# Patient Record
Sex: Female | Born: 1975
Health system: Southern US, Community
[De-identification: ages and names within clinical notes are randomized; demographics above are authoritative.]

## PROBLEM LIST (undated history)

## (undated) DIAGNOSIS — K589 Irritable bowel syndrome without diarrhea: Secondary | ICD-10-CM

## (undated) HISTORY — PX: ADENOIDECTOMY: SUR15

## (undated) HISTORY — PX: MANDIBLE SURGERY: SHX707

---

## 2016-10-04 ENCOUNTER — Encounter (HOSPITAL_COMMUNITY): Payer: Self-pay | Admitting: Family Medicine

## 2016-10-04 ENCOUNTER — Ambulatory Visit (HOSPITAL_COMMUNITY)
Admission: EM | Admit: 2016-10-04 | Discharge: 2016-10-04 | Disposition: A | Discharge status: Home / Self Care | Payer: BLUE CROSS/BLUE SHIELD | Attending: Internal Medicine | Admitting: Internal Medicine

## 2016-10-04 DIAGNOSIS — R350 Frequency of micturition: Secondary | ICD-10-CM | POA: Diagnosis not present

## 2016-10-04 DIAGNOSIS — N390 Urinary tract infection, site not specified: Secondary | ICD-10-CM | POA: Diagnosis not present

## 2016-10-04 DIAGNOSIS — M545 Low back pain: Secondary | ICD-10-CM | POA: Diagnosis not present

## 2016-10-04 DIAGNOSIS — Z3202 Encounter for pregnancy test, result negative: Secondary | ICD-10-CM

## 2016-10-04 HISTORY — DX: Irritable bowel syndrome without diarrhea: K58.9

## 2016-10-04 LAB — POCT URINALYSIS DIP (DEVICE)
Bilirubin Urine: NEGATIVE
Glucose, UA: NEGATIVE mg/dL
Ketones, ur: NEGATIVE mg/dL
Nitrite: NEGATIVE
Protein, ur: 30 mg/dL — AB
Specific Gravity, Urine: 1.01 (ref 1.005–1.030)
Urobilinogen, UA: 0.2 mg/dL (ref 0.0–1.0)
pH: 6 (ref 5.0–8.0)

## 2016-10-04 LAB — POCT PREGNANCY, URINE: Preg Test, Ur: NEGATIVE

## 2016-10-04 MED ORDER — CEPHALEXIN 500 MG PO CAPS: 500.0000 mg | ORAL_CAPSULE | Freq: Four times a day (QID) | ORAL | 0 refills | Status: DC

## 2016-10-04 MED ORDER — FLUCONAZOLE 150 MG PO TABS: 150.0000 mg | ORAL_TABLET | Freq: Every day | ORAL | 1 refills | Status: DC

## 2016-10-04 NOTE — ED Triage Notes (Signed)
Pt here for lower back pain, abd pain and urinary frequency.

## 2016-10-04 NOTE — Discharge Instructions (Signed)
Return if any problems.

## 2016-10-05 NOTE — ED Provider Notes (Signed)
[facility]ENC[facility] Provider Note   CSN: [id number] Arrival date & time: 10/04/16  1700     History   Chief Complaint Chief Complaint  Patient presents with  . Urinary Frequency  . Back Pain    HPI Erika Kemp is a 41 y.o. female.  The history is provided by the patient. No language interpreter was used.  Urinary Frequency  This is a new problem. The current episode started yesterday. The problem occurs constantly. The problem has been gradually worsening. Nothing aggravates the symptoms. Nothing relieves the symptoms. She has tried nothing for the symptoms. The treatment provided no relief.  Back Pain    Pt complains of burning with urination  Pt reports ncresed frequency.  Pt has some lower abdominal soreness  Past Medical History:  Diagnosis Date  . IBS (irritable bowel syndrome)     There are no active problems to display for this patient.   History reviewed. No pertinent surgical history.  OB History    No data available       Home Medications    Prior to Admission medications   Medication Sig Start Date End Date Taking? Authorizing Provider  cephALEXin (KEFLEX) 500 MG capsule Take 1 capsule (500 mg total) by mouth 4 (four) times daily. 10/04/16   Elson AreasSofia, Kacin Dancy K, PA-C  fluconazole (DIFLUCAN) 150 MG tablet Take 1 tablet (150 mg total) by mouth daily. 10/04/16   Elson AreasSofia, Clide Remmers K, PA-C    Family History History reviewed. No pertinent family history.  Social History Social History  Substance Use Topics  . Smoking status: Never Smoker  . Smokeless tobacco: Never Used  . Alcohol use Not on file     Allergies   Other   Review of Systems Review of Systems  Genitourinary: Positive for frequency.  Musculoskeletal: Positive for back pain.  All other systems reviewed and are negative.    Physical Exam Updated Vital Signs BP (!) 111/93   Pulse 89   Temp 98.6 F (37 C)   Resp 18   LMP 09/26/2016   SpO2 100%   Physical Exam  Constitutional:  She appears well-developed and well-nourished. No distress.  HENT:  Head: Normocephalic and atraumatic.  Eyes: Conjunctivae are normal.  Neck: Neck supple.  Cardiovascular: Normal rate and regular rhythm.   No murmur heard. Pulmonary/Chest: Effort normal and breath sounds normal. No respiratory distress.  Abdominal: Soft. There is no tenderness.  Musculoskeletal: She exhibits no edema.  Neurological: She is alert.  Skin: Skin is warm and dry.  Psychiatric: She has a normal mood and affect.  Nursing note and vitals reviewed.    ED Treatments / Results  Labs (all labs ordered are listed, but only abnormal results are displayed) Labs Reviewed  POCT URINALYSIS DIP (DEVICE) - Abnormal; Notable for the following:       Result Value   Hgb urine dipstick SMALL (*)    Protein, ur 30 (*)    Leukocytes, UA MODERATE (*)    All other components within normal limits  POCT PREGNANCY, URINE    EKG  EKG Interpretation None       Radiology No results found.  Procedures Procedures (including critical care time)  Medications Ordered in ED Medications - No data to display   Initial Impression / Assessment and Plan / ED Course  I have reviewed the triage vital signs and the nursing notes.  Pertinent labs & imaging results that were available during my care of the patient were reviewed by  me and considered in my medical decision making (see chart for details).     Pt counseled on uti management.  Pt advised to follow up with MD for recheck.   Final Clinical Impressions(s) / ED Diagnoses   Final diagnoses:  Urinary tract infection without hematuria, site unspecified    New Prescriptions Discharge Medication List as of 10/04/2016  7:07 PM    START taking these medications   Details  cephALEXin (KEFLEX) 500 MG capsule Take 1 capsule (500 mg total) by mouth 4 (four) times daily., Starting Tue 10/04/2016, Normal    fluconazole (DIFLUCAN) 150 MG tablet Take 1 tablet (150 mg total)  by mouth daily., Starting Tue 10/04/2016, Normal      An After Visit Summary was printed and given to the patient.    Elson Areas, [place] 10/05/16 0710

## 2017-03-30 DIAGNOSIS — Z1231 Encounter for screening mammogram for malignant neoplasm of breast: Secondary | ICD-10-CM | POA: Diagnosis not present

## 2017-04-13 DIAGNOSIS — Z681 Body mass index (BMI) 19 or less, adult: Secondary | ICD-10-CM | POA: Diagnosis not present

## 2017-04-13 DIAGNOSIS — Z1151 Encounter for screening for human papillomavirus (HPV): Secondary | ICD-10-CM | POA: Diagnosis not present

## 2017-04-13 DIAGNOSIS — Z01419 Encounter for gynecological examination (general) (routine) without abnormal findings: Secondary | ICD-10-CM | POA: Diagnosis not present

## 2018-02-09 ENCOUNTER — Encounter (HOSPITAL_COMMUNITY): Payer: Self-pay | Admitting: Emergency Medicine

## 2018-02-09 ENCOUNTER — Ambulatory Visit (HOSPITAL_COMMUNITY)
Admission: EM | Admit: 2018-02-09 | Discharge: 2018-02-09 | Disposition: A | Discharge status: Home / Self Care | Payer: BLUE CROSS/BLUE SHIELD | Attending: Family Medicine | Admitting: Family Medicine

## 2018-02-09 DIAGNOSIS — R1013 Epigastric pain: Secondary | ICD-10-CM | POA: Diagnosis not present

## 2018-02-09 DIAGNOSIS — R0789 Other chest pain: Secondary | ICD-10-CM | POA: Diagnosis not present

## 2018-02-09 MED ORDER — OMEPRAZOLE 20 MG PO CPDR: 20.0000 mg | DELAYED_RELEASE_CAPSULE | Freq: Every day | ORAL | 0 refills | Status: AC

## 2018-02-09 MED ORDER — GI COCKTAIL ~~LOC~~
ORAL | Status: AC
  Filled 2018-02-09: qty 30

## 2018-02-09 MED ORDER — GI COCKTAIL ~~LOC~~
30.0000 mL | Freq: Once | ORAL | Status: AC
  Administered 2018-02-09: 30 mL via ORAL

## 2018-02-09 MED ORDER — ONDANSETRON 4 MG PO TBDP: 4.0000 mg | ORAL_TABLET | Freq: Three times a day (TID) | ORAL | 0 refills | Status: AC | PRN

## 2018-02-09 NOTE — Discharge Instructions (Signed)
Zofran for nausea and vomiting as needed. Omeprazole as directed. Keep hydrated, you urine should be clear to pale yellow in color. Bland diet, advance as tolerated. Avoid coffee, alcohol, fatty/spicy foods for now. Avoid taking NSAIDs (ibuprofen, naproxen) as it can upset the stomach. If with worsening chest pain, shortness of breath, nausea/vomiting, or develop other symptoms such as dizziness, weakness, passing out, please go to the emergency department for further evaluation needed.

## 2018-02-09 NOTE — ED Triage Notes (Signed)
PT C/O: intermittent CP onset 2 days associated w/nausea, vomiting and left arm numbness, dyspnea  Thought it was heart burn so she took OTC meds w/no relief.     DENIES: CAD, Denies CP at the moment, diaphoresis  TAKING MEDS: Heart burn  A&O x4... NAD... Ambulatory

## 2018-02-09 NOTE — ED Provider Notes (Signed)
[facility]    CSN: [id number] Arrival date & time: 02/09/18  0831    History   Chief Complaint Chief Complaint  Patient presents with  . Chest Pain    HPI Erika Kemp is a 42 y.o. female.   42 year old female with history of IBS comes in for 2 day history of intermittent chest pain. Patient states symptoms first started with burning sensation to the mid chest/epigastric region. States took zantac, but had one episode of nonbilious nonbloody vomit shortly after. She took another zantac and sat up in a chair, states felt better after being upright. States since then symptoms have been intermittent without obvious aggravating or alleviating factor. She feels hard to take a deep breath with the dry heaves and burning sensation of the chest. She states she used to regularly work out, but slowed down 3 months ago due to schedule. She is still active with 2 young children and denies any exertional chest pain, fatigue, dyspnea on exertion. Denies weakness, dizziness, diaphoresis, syncope. Denies fever, chills, night sweats. Denies abdominal pain, diarrhea. She states has left upper arm/shoulder soreness that she thinks is due to sleeping position. Denies left arm numbness. Denies personal or family history of heart disease. Denies recent travels, increase NSAID use. States symptoms has been improving in the past 2 days.      Past Medical History:  Diagnosis Date  . IBS (irritable bowel syndrome)     There are no active problems to display for this patient.   History reviewed. No pertinent surgical history.  OB History   None      Home Medications    Prior to Admission medications   Medication Sig Start Date End Date Taking? Authorizing Provider  Norgestimate-Ethinyl Estradiol Triphasic (ORTHO TRI-CYCLEN LO) 0.18/0.215/0.25 MG-25 MCG tab Take 1 tablet by mouth daily.   Yes [provider]  sertraline (ZOLOFT) 25 MG tablet Take 25 mg by mouth daily.   Yes  [provider]  omeprazole (PRILOSEC) 20 MG capsule Take 1 capsule (20 mg total) by mouth daily. 02/09/18   Cathie Hoops, Amy V, PA-C  ondansetron (ZOFRAN ODT) 4 MG disintegrating tablet Take 1 tablet (4 mg total) by mouth every 8 (eight) hours as needed for nausea or vomiting. 02/09/18   Belinda Fisher, PA-C    Family History History reviewed. No pertinent family history.  Social History Social History   Tobacco Use  . Smoking status: Never Smoker  . Smokeless tobacco: Never Used  Substance Use Topics  . Alcohol use: Yes  . Drug use: Never     Allergies   Other   Review of Systems Review of Systems  Reason unable to perform ROS: See HPI as above.     Physical Exam Triage Vital Signs ED Triage Vitals  Enc Vitals Group     BP 02/09/18 0846 101/64     Pulse Rate 02/09/18 0846 82     Resp 02/09/18 0846 20     Temp 02/09/18 0846 98.2 F (36.8 C)     Temp Source 02/09/18 0846 Oral     SpO2 02/09/18 0846 96 %     Weight --      Height --      Head Circumference --      Peak Flow --      Pain Score 02/09/18 0847 0     Pain Loc --      Pain Edu? --      Excl. in  GC? --    No data found.  Updated Vital Signs BP 101/64 (BP Location: Left Arm)   Pulse 82   Temp 98.2 F (36.8 C) (Oral)   Resp 20   LMP 01/19/2018   SpO2 96%   Physical Exam  Constitutional: She is oriented to person, place, and time. She appears well-developed and well-nourished.  Non-toxic appearance. She does not appear ill. No distress.  HENT:  Head: Normocephalic and atraumatic.  Eyes: Pupils are equal, round, and reactive to light. EOM are normal.  Neck: Normal range of motion. Neck supple.  Cardiovascular: Normal rate and regular rhythm. Exam reveals no gallop and no friction rub.  No murmur heard. Pulmonary/Chest: Effort normal and breath sounds normal. No accessory muscle usage or stridor. No tachypnea. No respiratory distress. She has no decreased breath sounds. She has no wheezes. She has  no rhonchi. She has no rales.  Speaking in full sentences without difficulty. Mid sternal tenderness to palpation.   Abdominal: Soft. Bowel sounds are normal.  Mild epigastric tenderness to palpation. No guarding, rebound.  Musculoskeletal:       Right lower leg: Normal. She exhibits no edema.       Left lower leg: Normal. She exhibits no edema.  Neurological: She is alert and oriented to person, place, and time.  Skin: Skin is warm and dry.  Psychiatric: She has a normal mood and affect. Her behavior is normal.     UC Treatments / Results  Labs (all labs ordered are listed, but only abnormal results are displayed) Labs Reviewed - No data to display  EKG None  Radiology No results found.  Procedures Procedures (including critical care time)  Medications Ordered in UC Medications  gi cocktail (Maalox,Lidocaine,Donnatal) (30 mLs Oral Given 02/09/18 1610)    Initial Impression / Assessment and Plan / UC Course  I have reviewed the triage vital signs and the nursing notes.  Pertinent labs & imaging results that were available during my care of the patient were reviewed by me and considered in my medical decision making (see chart for details).     EKG NSR 68bpm, no acute ST changes, no prior to compare. Patient without history of heart disease, exertional chest pain/fatigue. Although concerning symptoms, lower suspicion for ACS. Will provide GI cocktail for possible GERD. Omeprazole as directed, zofran as needed. Push fluids. Foods to avoid discussed. Strict return precautions given. Patient expresses understanding and agrees to plan.  Final Clinical Impressions(s) / UC Diagnoses   Final diagnoses:  Atypical chest pain    ED Prescriptions    Medication Sig Dispense Auth. Provider   omeprazole (PRILOSEC) 20 MG capsule Take 1 capsule (20 mg total) by mouth daily. 10 capsule Yu, Amy V, PA-C   ondansetron (ZOFRAN ODT) 4 MG disintegrating tablet Take 1 tablet (4 mg total) by  mouth every 8 (eight) hours as needed for nausea or vomiting. 15 tablet Threasa Alpha, [place] 02/09/18 [phone]

## 2018-06-20 ENCOUNTER — Other Ambulatory Visit: Payer: Self-pay

## 2018-06-20 ENCOUNTER — Encounter (HOSPITAL_BASED_OUTPATIENT_CLINIC_OR_DEPARTMENT_OTHER): Payer: Self-pay

## 2018-06-20 ENCOUNTER — Emergency Department (HOSPITAL_BASED_OUTPATIENT_CLINIC_OR_DEPARTMENT_OTHER): Payer: 59

## 2018-06-20 ENCOUNTER — Emergency Department (HOSPITAL_BASED_OUTPATIENT_CLINIC_OR_DEPARTMENT_OTHER)
Admission: EM | Admit: 2018-06-20 | Discharge: 2018-06-20 | Disposition: A | Discharge status: Home / Self Care | Payer: 59 | Attending: Emergency Medicine | Admitting: Emergency Medicine

## 2018-06-20 DIAGNOSIS — E876 Hypokalemia: Secondary | ICD-10-CM | POA: Insufficient documentation

## 2018-06-20 DIAGNOSIS — R1013 Epigastric pain: Secondary | ICD-10-CM

## 2018-06-20 DIAGNOSIS — R1011 Right upper quadrant pain: Secondary | ICD-10-CM | POA: Diagnosis not present

## 2018-06-20 LAB — COMPREHENSIVE METABOLIC PANEL
ALT: 11 U/L (ref 0–44)
AST: 14 U/L — ABNORMAL LOW (ref 15–41)
Albumin: 4.2 g/dL (ref 3.5–5.0)
Alkaline Phosphatase: 43 U/L (ref 38–126)
Anion gap: 7 (ref 5–15)
BUN: 13 mg/dL (ref 6–20)
CO2: 26 mmol/L (ref 22–32)
Calcium: 9.1 mg/dL (ref 8.9–10.3)
Chloride: 103 mmol/L (ref 98–111)
Creatinine, Ser: 0.57 mg/dL (ref 0.44–1.00)
GFR calc Af Amer: >60 mL/min (ref >60)
GFR calc non Af Amer: >60 mL/min (ref >60)
Glucose, Bld: 83 mg/dL (ref 70–99)
Potassium: 3 mmol/L — ABNORMAL LOW (ref 3.5–5.1)
Sodium: 136 mmol/L (ref 135–145)
Total Bilirubin: 0.9 mg/dL (ref 0.3–1.2)
Total Protein: 7.2 g/dL (ref 6.5–8.1)

## 2018-06-20 LAB — CBC WITH DIFFERENTIAL/PLATELET
Abs Immature Granulocytes: 0.02 10*3/uL (ref 0.00–0.07)
Basophils Absolute: 0 10*3/uL (ref 0.0–0.1)
Basophils Relative: 0 %
Eosinophils Absolute: 0.1 10*3/uL (ref 0.0–0.5)
Eosinophils Relative: 1 %
HCT: 38.5 % (ref 36.0–46.0)
Hemoglobin: 12.4 g/dL (ref 12.0–15.0)
Immature Granulocytes: 0 %
Lymphocytes Relative: 25 %
Lymphs Abs: 2.1 10*3/uL (ref 0.7–4.0)
MCH: 30 pg (ref 26.0–34.0)
MCHC: 32.2 g/dL (ref 30.0–36.0)
MCV: 93 fL (ref 80.0–100.0)
Monocytes Absolute: 0.4 10*3/uL (ref 0.1–1.0)
Monocytes Relative: 5 %
Neutro Abs: 5.5 10*3/uL (ref 1.7–7.7)
Neutrophils Relative %: 69 %
Platelets: 301 10*3/uL (ref 150–400)
RBC: 4.14 MIL/uL (ref 3.87–5.11)
RDW: 12.2 % (ref 11.5–15.5)
WBC: 8.1 10*3/uL (ref 4.0–10.5)
nRBC: 0 % (ref 0.0–0.2)

## 2018-06-20 LAB — LIPASE, BLOOD: Lipase: 22 U/L (ref 11–51)

## 2018-06-20 LAB — PREGNANCY, URINE: Preg Test, Ur: NEGATIVE

## 2018-06-20 LAB — TROPONIN I: Troponin I: <0.03 ng/mL (ref <0.03)

## 2018-06-20 MED ORDER — FAMOTIDINE IN NACL 20-0.9 MG/50ML-% IV SOLN
20.0000 mg | Freq: Once | INTRAVENOUS | Status: AC
  Administered 2018-06-20: 20 mg via INTRAVENOUS
  Filled 2018-06-20: qty 50

## 2018-06-20 MED ORDER — SODIUM CHLORIDE 0.9 % IV BOLUS
1000.0000 mL | Freq: Once | INTRAVENOUS | Status: AC
  Administered 2018-06-20: 1000 mL via INTRAVENOUS

## 2018-06-20 MED ORDER — PANTOPRAZOLE SODIUM 20 MG PO TBEC
20.0000 mg | DELAYED_RELEASE_TABLET | Freq: Every day | ORAL | 0 refills | Status: AC
Start: 2018-06-20 — End: ?

## 2018-06-20 MED ORDER — POTASSIUM CHLORIDE CRYS ER 20 MEQ PO TBCR: 20.0000 meq | EXTENDED_RELEASE_TABLET | Freq: Every day | ORAL | 0 refills | Status: AC

## 2018-06-20 MED ORDER — ALUM & MAG HYDROXIDE-SIMETH 200-200-20 MG/5ML PO SUSP
15.0000 mL | Freq: Once | ORAL | Status: AC
  Administered 2018-06-20: 15 mL via ORAL
  Filled 2018-06-20: qty 30

## 2018-06-20 MED ORDER — SUCRALFATE 1 G PO TABS: 1.0000 g | ORAL_TABLET | Freq: Three times a day (TID) | ORAL | 0 refills | Status: AC

## 2018-06-20 MED ORDER — POTASSIUM CHLORIDE CRYS ER 20 MEQ PO TBCR
40.0000 meq | EXTENDED_RELEASE_TABLET | Freq: Once | ORAL | Status: AC
  Administered 2018-06-20: 40 meq via ORAL
  Filled 2018-06-20: qty 2

## 2018-06-20 MED ORDER — DIPHENHYDRAMINE HCL 50 MG/ML IJ SOLN
25.0000 mg | Freq: Once | INTRAMUSCULAR | Status: AC
  Administered 2018-06-20: 25 mg via INTRAVENOUS
  Filled 2018-06-20: qty 1

## 2018-06-20 MED ORDER — METOCLOPRAMIDE HCL 5 MG/ML IJ SOLN
10.0000 mg | Freq: Once | INTRAMUSCULAR | Status: AC
  Administered 2018-06-20: 10 mg via INTRAVENOUS
  Filled 2018-06-20: qty 2

## 2018-06-20 NOTE — ED Triage Notes (Signed)
Pt c/o "heart burn, reflux"-points to epigastric pain-started 2/15-no relief with OTC meds-c/o upper back pain x today, left arm numbness x 2 days-NAD-steady gait

## 2018-06-20 NOTE — ED Notes (Signed)
ED Provider at bedside. 

## 2018-06-20 NOTE — ED Notes (Addendum)
Pt took pepcid today and has been taking medication for indigestion and  husbands protonix with no relief. Decreased appetite and not able to drink fluids per pt statement. Last eaten was a few cracker today. Pt states it increases epigastric pain when she eats or drinks

## 2018-06-20 NOTE — ED Provider Notes (Signed)
[facility] EMERGENCY DEPARTMENT Provider Note   CSN: [id number] Arrival date & time: 06/20/18  1508    History   Chief Complaint Chief Complaint  Patient presents with  . Epigastric pain    HPI Erika Kemp is a 43 y.o. female.     HPI  Patient is a 43 yo female with a history of IBS presenting for epigastric pain, chest pain rating to the back, and painful swallowing.  Patient reports that her symptoms began 4 days ago and woke her up around 4 AM.  She reports that she had a epigastric burning sensation.  Patient reports she has tried multiple over-the-counter antacid medications including famotidine, Alka-Seltzer, without relief.  She reports that is been progressively worsening over the week.  She reports that anytime she is tried to swallow something she will feel a cramping in her chest.  She reports that it occurs with both liquids and solid patient denies any regurgitation of undigested food.  She reports that it is started to radiate into her back and is now into the right upper quadrant.  Patient denies feeling short of breath, but she does report that she has increased epigastric burning when taking a deep breath.  Patient ports she does not have a regular history of GERD excepting when she was pregnant.  Patient still has her gallbladder.  Patient does report that she had an episode in October 2019 of similar symptoms when she went to urgent care and was diagnosed with GERD and given omeprazole.  She reports that she is not currently still taking this medication.  Patient denies any history of cardiovascular disease, family history of early cardiovascular disease.  Patient denies any history of PE, recent immobilization, hospitalization, cancer, recent surgery, hemoptysis, lower extremity edema or calf tenderness.  Patient does take OCPs. Patient is a nonsmoker.   Past Medical History:  Diagnosis Date  . IBS (irritable bowel syndrome)     There are no active  problems to display for this patient.   Past Surgical History:  Procedure Laterality Date  . ADENOIDECTOMY    . CESAREAN SECTION    . MANDIBLE SURGERY       OB History   No obstetric history on file.      Home Medications    Prior to Admission medications   Medication Sig Start Date End Date Taking? Authorizing Provider  Norgestimate-Ethinyl Estradiol Triphasic (ORTHO TRI-CYCLEN LO) 0.18/0.215/0.25 MG-25 MCG tab Take 1 tablet by mouth daily.    [provider]  omeprazole (PRILOSEC) 20 MG capsule Take 1 capsule (20 mg total) by mouth daily. 02/09/18   Cathie Hoops, Amy V, PA-C  ondansetron (ZOFRAN ODT) 4 MG disintegrating tablet Take 1 tablet (4 mg total) by mouth every 8 (eight) hours as needed for nausea or vomiting. 02/09/18   Cathie Hoops, Amy V, PA-C  sertraline (ZOLOFT) 25 MG tablet Take 25 mg by mouth daily.    [provider]    Family History No family history on file.  Social History Social History   Tobacco Use  . Smoking status: Never Smoker  . Smokeless tobacco: Never Used  Substance Use Topics  . Alcohol use: Yes    Comment: occ  . Drug use: Never     Allergies   Other and Phenergan [promethazine]   Review of Systems Review of Systems  Constitutional: Negative for chills and fever.  HENT: Negative for congestion, rhinorrhea, sinus pain and sore throat.   Eyes: Negative for visual disturbance.  Respiratory: Negative for cough, chest tightness and shortness of breath.   Cardiovascular: Positive for chest pain. Negative for palpitations and leg swelling.  Gastrointestinal: Positive for abdominal pain. Negative for blood in stool, constipation, diarrhea, nausea and vomiting.  Genitourinary: Negative for dysuria and flank pain.  Musculoskeletal: Negative for back pain and myalgias.  Skin: Negative for rash.  Neurological: Negative for dizziness, syncope, light-headedness and headaches.     Physical Exam Updated Vital Signs BP 122/81 (BP Location:  Left Arm)   Pulse 78   Temp 98 F (36.7 C) (Oral)   Resp 18   Ht 5\' 4"  (1.626 m)   Wt 51.7 kg   LMP 06/15/2018   SpO2 100%   BMI 19.56 kg/m   Physical Exam Vitals signs and nursing note reviewed.  Constitutional:      General: She is not in acute distress.    Appearance: She is well-developed.  HENT:     Head: Normocephalic and atraumatic.  Eyes:     Conjunctiva/sclera: Conjunctivae normal.     Pupils: Pupils are equal, round, and reactive to light.  Neck:     Musculoskeletal: Normal range of motion and neck supple.  Cardiovascular:     Rate and Rhythm: Normal rate and regular rhythm.     Heart sounds: S1 normal and S2 normal. No murmur.  Pulmonary:     Effort: Pulmonary effort is normal.     Breath sounds: Normal breath sounds. No wheezing or rales.  Abdominal:     General: There is no distension.     Palpations: Abdomen is soft.     Tenderness: There is no abdominal tenderness. There is no guarding.  Genitourinary:    Comments: Rectal exam performed with RN chaperone present.  Normal rectal tone.  Small amount of light brown stool present in rectal vault.  No melena or hematochezia. Musculoskeletal: Normal range of motion.        General: No deformity.  Lymphadenopathy:     Cervical: No cervical adenopathy.  Skin:    General: Skin is warm and dry.     Findings: No erythema or rash.  Neurological:     Mental Status: She is alert.     Comments: Cranial nerves grossly intact. Patient moves extremities symmetrically and with good coordination.  Psychiatric:        Behavior: Behavior normal.        Thought Content: Thought content normal.        Judgment: Judgment normal.      ED Treatments / Results  Labs (all labs ordered are listed, but only abnormal results are displayed) Labs Reviewed  COMPREHENSIVE METABOLIC PANEL - Abnormal; Notable for the following components:      Result Value   Potassium 3.0 (*)    AST 14 (*)    All other components within normal  limits  CBC WITH DIFFERENTIAL/PLATELET  TROPONIN I  LIPASE, BLOOD  PREGNANCY, URINE    EKG EKG Interpretation  Date/Time:  Wednesday June 20 2018 15:24:24 EST Ventricular Rate:  71 PR Interval:    QRS Duration: 93 QT Interval:  416 QTC Calculation: 453 R Axis:   93 Text Interpretation:  Sinus rhythm Borderline right axis deviation No significant change was found Confirmed by Azalia Bilisampos, Kevin ([id number]) on 06/20/2018 6:04:29 PM   Radiology Dg Chest 2 View  Result Date: 06/20/2018 CLINICAL DATA:  43 y/o  F; chest pain. EXAM: CHEST - 2 VIEW COMPARISON:  None. FINDINGS: The heart size and mediastinal contours  are within normal limits. Both lungs are clear. Mild S-shaped curvature of the spine. No acute osseous abnormality is evident. IMPRESSION: No active cardiopulmonary disease. Electronically Signed   By: Mitzi Hansen M.D.   On: 06/20/2018 17:32   US Abdomen Limited Ruq  Result Date: 06/20/2018 CLINICAL DATA:  Right upper quadrant pain for 5 days. EXAM: ULTRASOUND ABDOMEN LIMITED RIGHT UPPER QUADRANT COMPARISON:  None. FINDINGS: Gallbladder: No gallstones or wall thickening visualized. No sonographic Murphy sign noted by sonographer. Maximal wall thickness is 2 mm, within normal limits Common bile duct: Diameter: 1 mm, within normal limits Liver: 8 mm hyperechoic lesion in the inferior right lobe of the liver likely represents hemangioma. No other focal lesions are present. Portal vein is patent on color Doppler imaging with normal direction of blood flow towards the liver. IMPRESSION: Negative right upper quadrant ultrasound. No acute or focal abnormality to explain the patient's symptoms. Electronically Signed   By: Marin Roberts M.D.   On: 06/20/2018 18:05    Procedures Procedures (including critical care time)  Medications Ordered in ED Medications  sodium chloride 0.9 % bolus 1,000 mL (0 mLs Intravenous Stopped 06/20/18 1739)  famotidine (PEPCID) IVPB 20 mg  premix (0 mg Intravenous Stopped 06/20/18 1739)  metoCLOPramide (REGLAN) injection 10 mg (10 mg Intravenous Given 06/20/18 1632)  diphenhydrAMINE (BENADRYL) injection 25 mg (25 mg Intravenous Given 06/20/18 1630)  alum & mag hydroxide-simeth (MAALOX/MYLANTA) 200-200-20 MG/5ML suspension 15 mL (15 mLs Oral Given 06/20/18 1639)  potassium chloride SA (K-DUR,KLOR-CON) CR tablet 40 mEq (40 mEq Oral Given 06/20/18 1743)     Initial Impression / Assessment and Plan / ED Course  I have reviewed the triage vital signs and the nursing notes.  Pertinent labs & imaging results that were available during my care of the patient were reviewed by me and considered in my medical decision making (see chart for details).        Patient is nontoxic-appearing, hemodynamically stable, and in no acute distress.  Differential diagnosis includes esophagitis, GERD, pancreatitis, cholecystitis, biliary colic.  Doubtful of ACS, as patient has a low risk cardiac history, and heart score of 0.  Patient's troponin is negative, and EKG is consistent with prior with no evidence of ischemia, infarction, arrhythmia.  Doubt pulmonary embolism, as patient is not having any precipitating risk factors with the exception of her OCPs, but her symptoms are not consistent with pulmonary embolism.  Her pain is greatest with swallowing, and she is not experiencing shortness of breath.  Lab work today is showing hypokalemia 3.0, likely nutritional.  All other lab work normal.  Patient is not pregnant.  Chest x-ray demonstrates no mediastinal adenopathy.  Right upper quadrant ultrasound demonstrates no evidence of biliary pathology.  Patient has possible hemangioma, which I recommended follow-up with her gastroenterologist for.  Patient had initially mentioned some dark stools, however denies tarry stools.  Rectal exam was performed and no evidence of melena was found on exam.  Canceled occult blood in stool.   Patient has upcoming  gastroenterology appointment in 48 hours.  Recommended PPI, Carafate.  Patient struck to return sooner should she have any coffee-ground emesis, melena, intractable nausea vomiting or worsening pain.  Patient is in understanding and agrees with the plan of care.  Final Clinical Impressions(s) / ED Diagnoses   Final diagnoses:  RUQ pain  Epigastric pain  Hypokalemia    ED Discharge Orders         Ordered    pantoprazole (PROTONIX) 20  MG tablet  Daily     06/20/18 1849    sucralfate (CARAFATE) 1 g tablet  3 times daily with meals & bedtime     06/20/18 1849    potassium chloride SA (K-DUR,KLOR-CON) 20 MEQ tablet  Daily     06/20/18 1849           Delia ChimesMurray, Kately Graffam B, PA-C 06/20/18 1850    Azalia Bilisampos, Kevin, MD 06/21/18 [phone]

## 2018-06-20 NOTE — Discharge Instructions (Signed)
Please see the information and instructions below regarding your visit.  Your diagnoses today include:  1. Epigastric pain   2. RUQ pain   3. Hypokalemia     Tests performed today include: See side panel of your discharge paperwork for testing performed today. Vital signs are listed at the bottom of these instructions.   Medications prescribed:    Take any prescribed medications only as prescribed, and any over the counter medications only as directed on the packaging.  Please take Protonix daily in the morning before breakfast.  Please take 30 minutes before eating anything.  Please start taking a medicine called Carafate or sucralfate.  This can be taken up to 4 times a day, with meals and before bedtime.  Please do not take your proton pump inhibitor (Protonix, Nexium, Prilosec) within 30 minutes of taking Carafate.  Please take potassium 20 mEq for the next 3 days. On testing today, your potassium level was 3.0 which is slightly below normal. I suspect this is due to poor nutrition recently. Increasing potassium in your diet should be sufficient to make sure you are getting enough potassium. Below is a list of good sources and servings high in potassium (in order from greatest to least):  -1 cup cooked acorn squash -1 baked potato with skin -1 cup cooked spinach -1 cup cooked lentils -1 cup cooked kidney beans -Watermelon - cup raisins -1 cup plain yogurt -1 cup orange juice, frozen -Banana -1 cup 1% low-fat milk -1 cup cooked broccoli   Home care instructions:  Please follow any educational materials contained in this packet.   Follow-up instructions:  Please follow up with gastroenterology in 48 hours as previously scheduled.  Return instructions:  Please return to the Emergency Department if you experience worsening symptoms.  Please return the emergency department immediately if you develop any coffee ground colored vomit, dark and tarry stools, worsening pain,  nausea vomiting preventing you keeping anything down. Please return if you have any other emergent concerns.  Additional Information:   Your vital signs today were: BP 122/81 (BP Location: Left Arm)    Pulse 78    Temp 98 F (36.7 C) (Oral)    Resp 18    Ht 5\' 4"  (1.626 m)    Wt 51.7 kg    LMP 06/15/2018    SpO2 100%    BMI 19.56 kg/m  If your blood pressure (BP) was elevated on multiple readings during this visit above 130 for the top number or above 80 for the bottom number, please have this repeated by your primary care provider within one month. --------------  Thank you for allowing [place] to participate in your care today.

## 2020-04-20 IMAGING — US US ABDOMEN LIMITED
1 series · 14 of 25 positions shown · non-contrast
Comparison: None.

CLINICAL DATA: Right upper quadrant pain for 5 days.

EXAM:
ULTRASOUND ABDOMEN LIMITED RIGHT UPPER QUADRANT

[Series 1: us abdomen limited · 0.15mm/px · 14 of 46 slices shown]
[im 1/46]
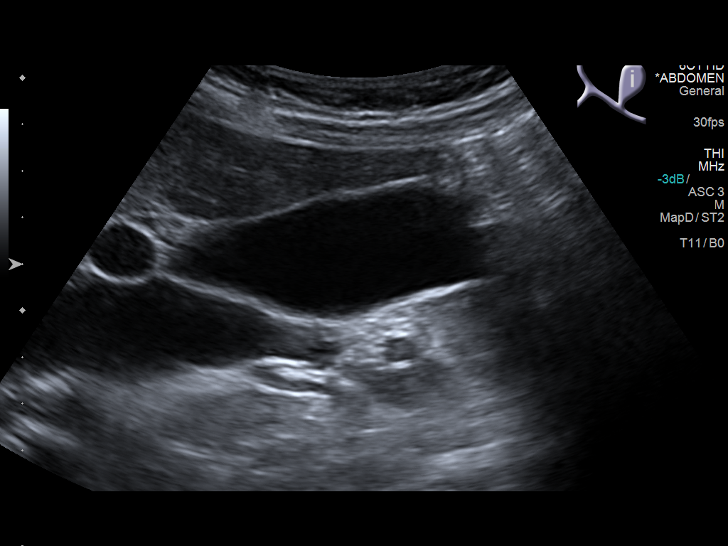
[im 4/46]
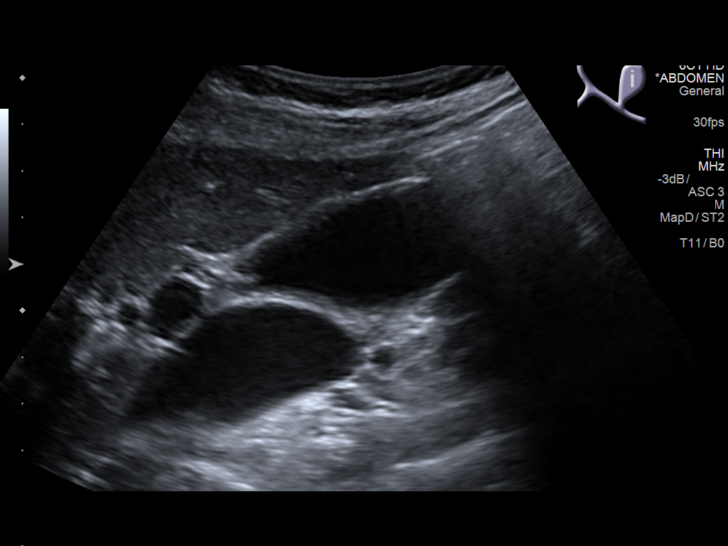
[im 8/46]
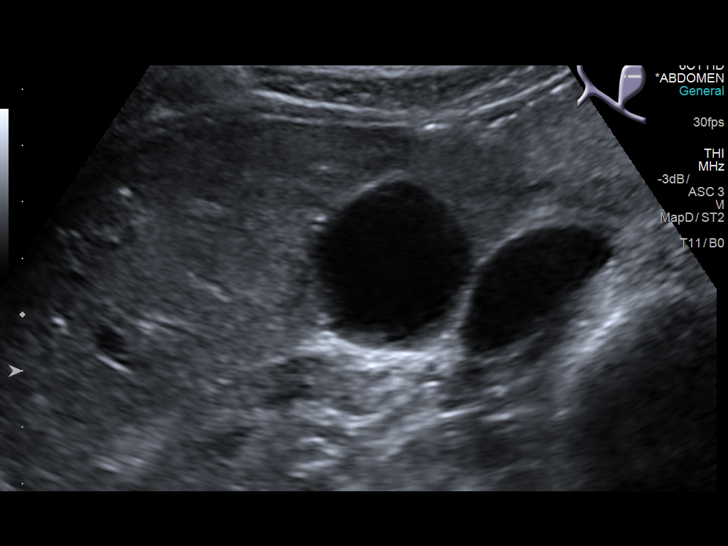
[im 12/46]
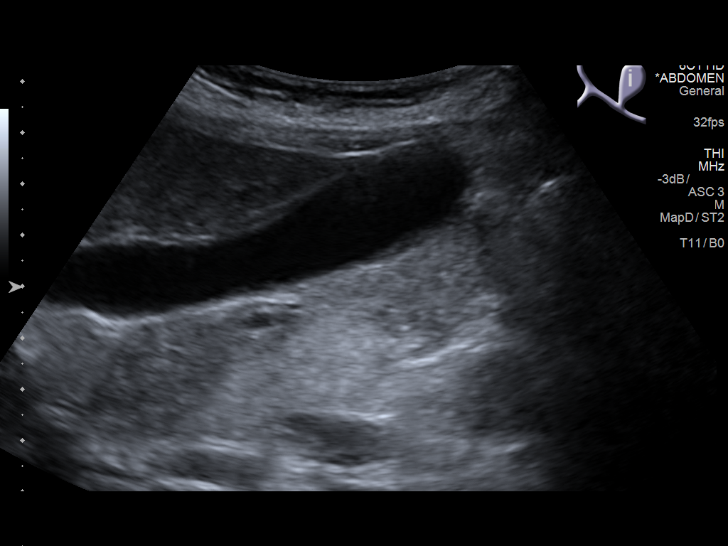
[im 16/46]
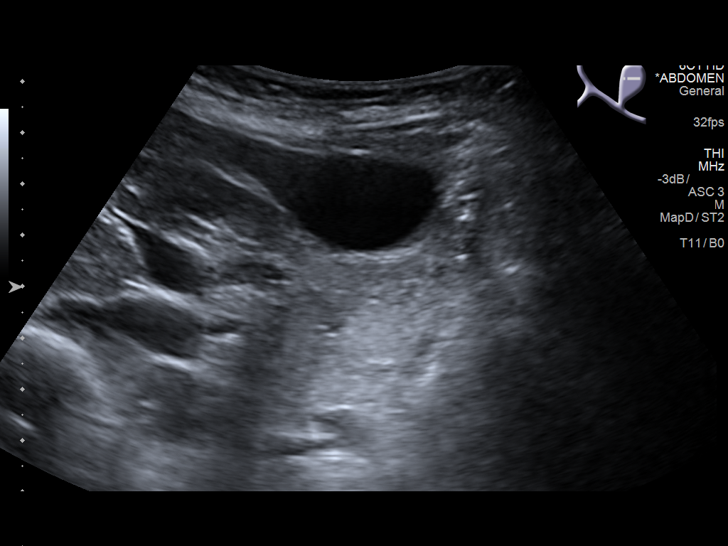
[im 17/46]
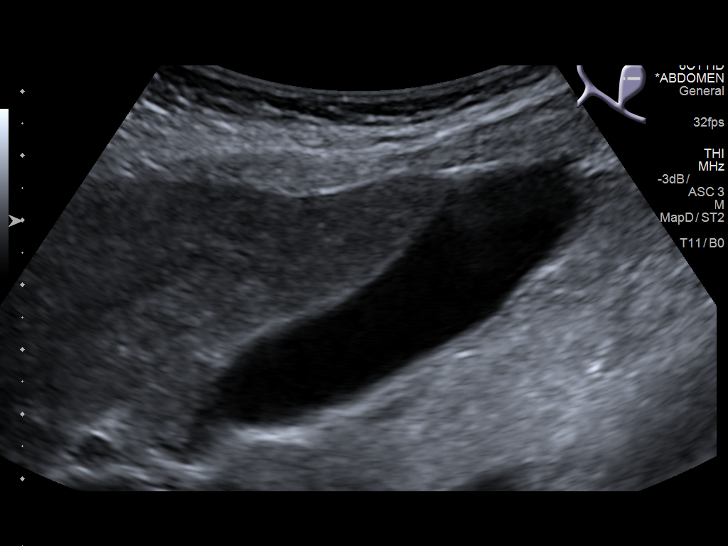
[im 21/46]
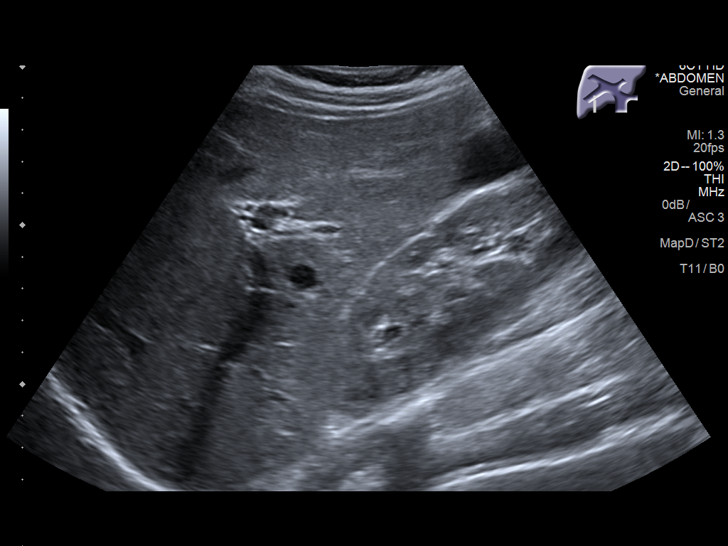
[im 25/46]
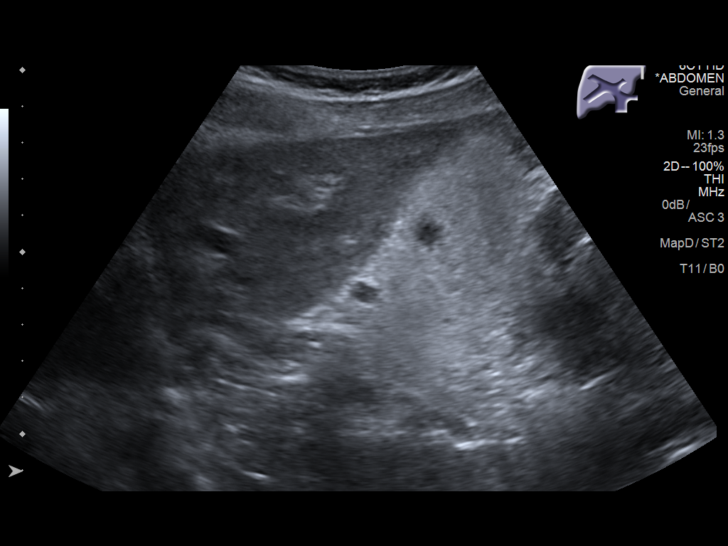
[im 29/46]
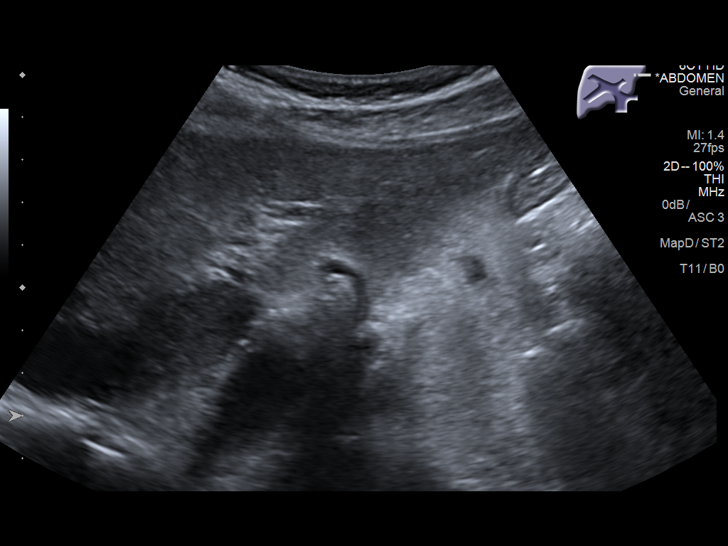
[im 31/46]
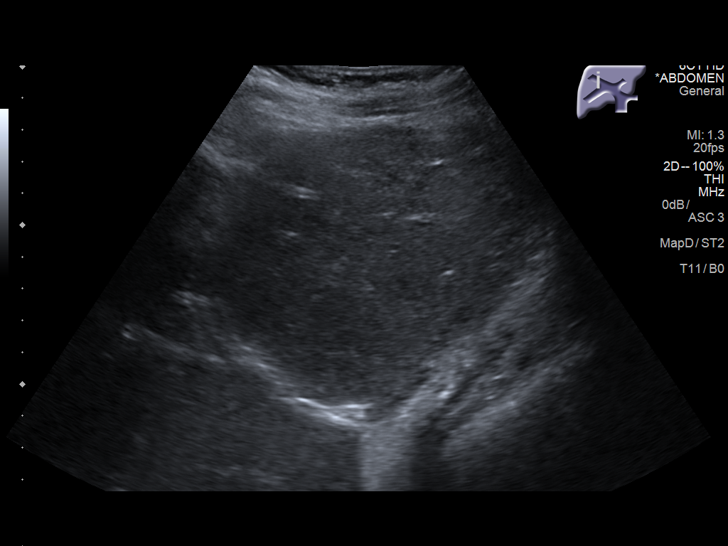
[im 34/46]
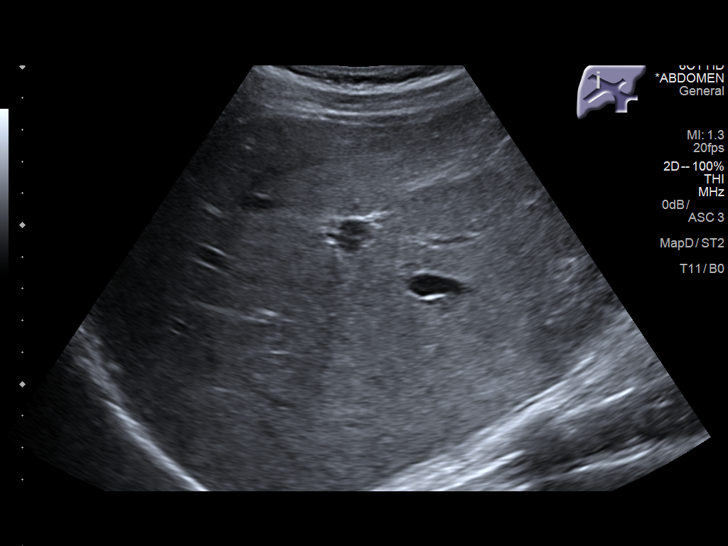
[im 38/46]
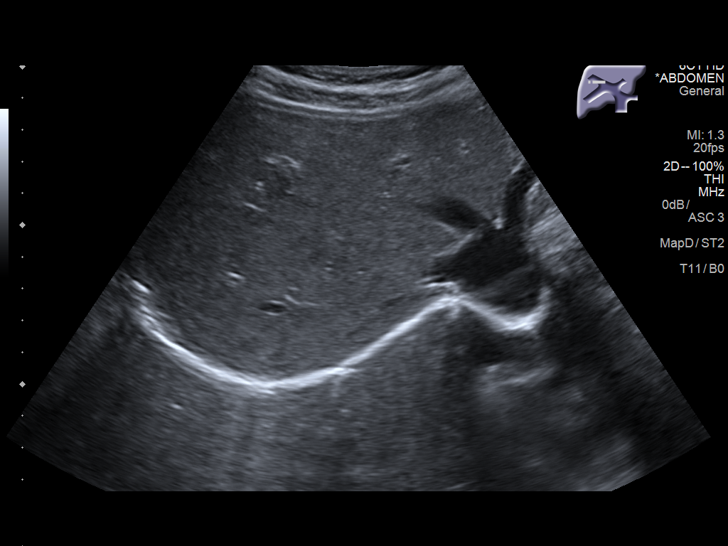
[im 42/46]
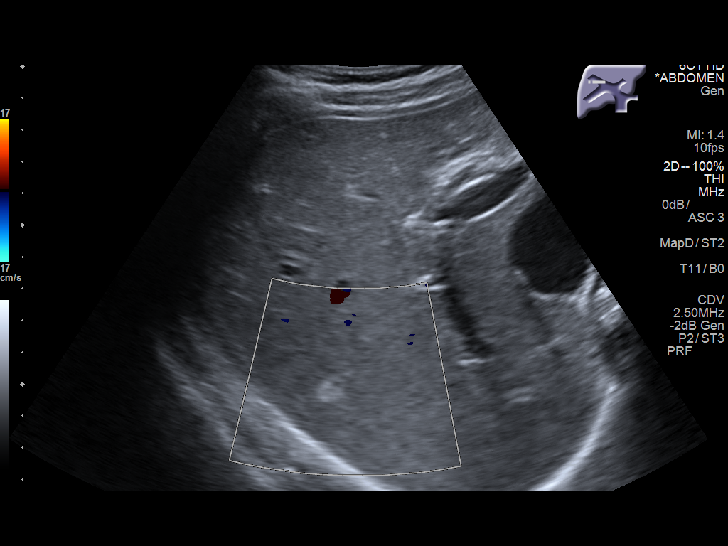
[im 46/46]
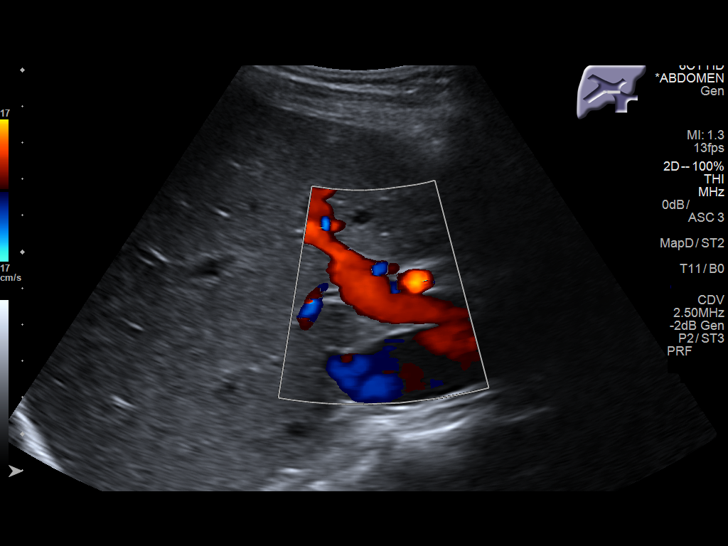

[14 of 25 positions shown; findings below may reference images not displayed]

FINDINGS: Gallbladder:

No gallstones or wall thickening visualized. No sonographic Murphy
sign noted by sonographer. Maximal wall thickness is 2 mm, within
normal limits

Common bile duct:

Diameter: 1 mm, within normal limits

Liver:

8 mm hyperechoic lesion in the inferior right lobe of the liver
likely represents hemangioma. No other focal lesions are present.
Portal vein is patent on color Doppler imaging with normal direction
of blood flow towards the liver.
IMPRESSION: Negative right upper quadrant ultrasound. No acute or focal
abnormality to explain the patient's symptoms.

## 2020-05-05 DIAGNOSIS — R1310 Dysphagia, unspecified: Secondary | ICD-10-CM | POA: Diagnosis not present

## 2020-05-05 DIAGNOSIS — R1032 Left lower quadrant pain: Secondary | ICD-10-CM | POA: Diagnosis not present

## 2020-05-05 DIAGNOSIS — K581 Irritable bowel syndrome with constipation: Secondary | ICD-10-CM | POA: Diagnosis not present

## 2020-05-05 DIAGNOSIS — R194 Change in bowel habit: Secondary | ICD-10-CM | POA: Diagnosis not present

## 2020-05-05 DIAGNOSIS — R1013 Epigastric pain: Secondary | ICD-10-CM | POA: Diagnosis not present

## 2020-05-05 DIAGNOSIS — K259 Gastric ulcer, unspecified as acute or chronic, without hemorrhage or perforation: Secondary | ICD-10-CM | POA: Diagnosis not present

## 2020-05-05 DIAGNOSIS — K219 Gastro-esophageal reflux disease without esophagitis: Secondary | ICD-10-CM | POA: Diagnosis not present

## 2020-05-06 DIAGNOSIS — K219 Gastro-esophageal reflux disease without esophagitis: Secondary | ICD-10-CM | POA: Diagnosis not present

## 2020-05-06 DIAGNOSIS — K2950 Unspecified chronic gastritis without bleeding: Secondary | ICD-10-CM | POA: Diagnosis not present

## 2020-05-06 DIAGNOSIS — R194 Change in bowel habit: Secondary | ICD-10-CM | POA: Diagnosis not present

## 2020-05-06 DIAGNOSIS — K3189 Other diseases of stomach and duodenum: Secondary | ICD-10-CM | POA: Diagnosis not present

## 2020-05-06 DIAGNOSIS — K648 Other hemorrhoids: Secondary | ICD-10-CM | POA: Diagnosis not present

## 2020-06-24 DIAGNOSIS — Z20822 Contact with and (suspected) exposure to covid-19: Secondary | ICD-10-CM | POA: Diagnosis not present

## 2020-06-24 DIAGNOSIS — U071 COVID-19: Secondary | ICD-10-CM | POA: Diagnosis not present

## 2020-06-29 ENCOUNTER — Other Ambulatory Visit (HOSPITAL_COMMUNITY): Payer: Self-pay | Admitting: Internal Medicine

## 2020-06-29 MED FILL — MOLNUPIRAVIR 200 MG CAPS: 200 | 5 days supply | Qty: 40 | Fill #0

## 2020-08-31 DIAGNOSIS — N898 Other specified noninflammatory disorders of vagina: Secondary | ICD-10-CM | POA: Diagnosis not present

## 2020-08-31 DIAGNOSIS — Z302 Encounter for sterilization: Secondary | ICD-10-CM | POA: Diagnosis not present

## 2020-09-24 DIAGNOSIS — Z1231 Encounter for screening mammogram for malignant neoplasm of breast: Secondary | ICD-10-CM | POA: Diagnosis not present

## 2020-09-24 DIAGNOSIS — Z124 Encounter for screening for malignant neoplasm of cervix: Secondary | ICD-10-CM | POA: Diagnosis not present

## 2020-09-24 DIAGNOSIS — Z113 Encounter for screening for infections with a predominantly sexual mode of transmission: Secondary | ICD-10-CM | POA: Diagnosis not present

## 2020-09-24 DIAGNOSIS — Z01419 Encounter for gynecological examination (general) (routine) without abnormal findings: Secondary | ICD-10-CM | POA: Diagnosis not present

## 2021-06-12 ENCOUNTER — Ambulatory Visit (HOSPITAL_COMMUNITY)
Admission: EM | Admit: 2021-06-12 | Discharge: 2021-06-12 | Disposition: A | Discharge status: Home / Self Care | Payer: BC Managed Care – PPO | Attending: Urgent Care | Admitting: Urgent Care

## 2021-06-12 ENCOUNTER — Other Ambulatory Visit: Payer: Self-pay

## 2021-06-12 ENCOUNTER — Encounter (HOSPITAL_COMMUNITY): Payer: Self-pay

## 2021-06-12 DIAGNOSIS — K122 Cellulitis and abscess of mouth: Secondary | ICD-10-CM | POA: Diagnosis not present

## 2021-06-12 DIAGNOSIS — Z20818 Contact with and (suspected) exposure to other bacterial communicable diseases: Secondary | ICD-10-CM

## 2021-06-12 LAB — POCT RAPID STREP A, ED / UC: Streptococcus, Group A Screen (Direct): NEGATIVE

## 2021-06-12 MED ORDER — PREDNISONE 10 MG PO TABS: ORAL_TABLET | ORAL | 0 refills | Status: AC

## 2021-06-12 MED ORDER — PENICILLIN V POTASSIUM 500 MG PO TABS: 500.0000 mg | ORAL_TABLET | Freq: Two times a day (BID) | ORAL | 0 refills | Status: AC

## 2021-06-12 NOTE — Discharge Instructions (Addendum)
Your rapid strep was NEGATIVE for strep throat. We have sent out a confirmation throat culture. Given your exposure to strep and your swollen uvula, I would like to start treatment regardless. Please start taking antibiotics as prescribed. Do not stop taking them until all has been completed. After completing the third day of antibiotics, throw away your current toothbrush and get a new one. This will prevent re-contamination. You may alternate ibuprofen and tylenol every 4 hours for fever and pain control. Do not share food, beverages, or kiss on the lips until >48 hours after starting antibiotics and >24 hours after fever resolved. This is contagious. Prednisone was called in for you to help with swelling of the uvula. Take in the morning.

## 2021-06-12 NOTE — ED Provider Notes (Signed)
[facility]    CSN: [id number] Arrival date & time: 06/12/21  1116      History   Chief Complaint Chief Complaint  Patient presents with   Sore Throat    HPI Erika Kemp is a 46 y.o. female.   Pleasant 47 year old female presents today due to concerns of a sore throat.  She states that on Tuesday this past week her son was diagnosed at the doctor with strep throat via a rapid strep test.  He started antibiotics and is now feeling better.  Patient states however yesterday she started having a severe sore throat, which is worse today.  She admits that they do share food often and is concerned she has strep.  She states that hurts to swallow, has body aches, and upset that stomach.   Sore Throat   Past Medical History:  Diagnosis Date   IBS (irritable bowel syndrome)     There are no problems to display for this patient.   Past Surgical History:  Procedure Laterality Date   ADENOIDECTOMY     CESAREAN SECTION     MANDIBLE SURGERY      OB History   No obstetric history on file.      Home Medications    Prior to Admission medications   Medication Sig Start Date End Date Taking? Authorizing Provider  penicillin v potassium (VEETID) 500 MG tablet Take 1 tablet (500 mg total) by mouth in the morning and at bedtime for 10 days. 06/12/21 06/22/21 Yes Reiana Poteet L, PA  predniSONE (DELTASONE) 10 MG tablet Take 2 tabs PO in a single dose day one and two, and 1 tab PO in a single dose day three and four 06/12/21  Yes Kristan Votta L, PA  Molnupiravir 200 MG CAPS TAKE 4 CAPSULES BY MOUTH EVERY 12 HOURS FOR 5 DAYS 06/29/20 06/29/21  Alysia Penna, MD  Norgestimate-Ethinyl Estradiol Triphasic (ORTHO TRI-CYCLEN LO) 0.18/0.215/0.25 MG-25 MCG tab Take 1 tablet by mouth daily.    [provider]  omeprazole (PRILOSEC) 20 MG capsule Take 1 capsule (20 mg total) by mouth daily. 02/09/18   Cathie Hoops, Amy V, PA-C  ondansetron (ZOFRAN ODT) 4 MG disintegrating tablet  Take 1 tablet (4 mg total) by mouth every 8 (eight) hours as needed for nausea or vomiting. 02/09/18   Cathie Hoops, Amy V, PA-C  pantoprazole (PROTONIX) 20 MG tablet Take 1 tablet (20 mg total) by mouth daily. 06/20/18   Aviva Kluver B, PA-C  potassium chloride SA (K-DUR,KLOR-CON) 20 MEQ tablet Take 1 tablet (20 mEq total) by mouth daily for 3 days. 06/20/18 06/23/18  Aviva Kluver B, PA-C  sertraline (ZOLOFT) 25 MG tablet Take 25 mg by mouth daily.    [provider]  sucralfate (CARAFATE) 1 g tablet Take 1 tablet (1 g total) by mouth 4 (four) times daily -  with meals and at bedtime for 14 days. 06/20/18 07/04/18  Elisha Ponder, PA-C    Family History No family history on file.  Social History Social History   Tobacco Use   Smoking status: Never   Smokeless tobacco: Never  Vaping Use   Vaping Use: Never used  Substance Use Topics   Alcohol use: Yes    Comment: occ   Drug use: Never     Allergies   Other and Phenergan [promethazine]   Review of Systems Review of Systems  HENT:  Positive for sore throat.   Musculoskeletal:  Positive for myalgias.  All other systems reviewed  and are negative.   Physical Exam Triage Vital Signs ED Triage Vitals [06/12/21 1321]  Enc Vitals Group     BP 118/75     Pulse Rate 88     Resp 16     Temp 97.7 F (36.5 C)     Temp Source Oral     SpO2 100 %     Weight      Height      Head Circumference      Peak Flow      Pain Score 5     Pain Loc      Pain Edu?      Excl. in GC?    No data found.  Updated Vital Signs BP 118/75 (BP Location: Left Arm)    Pulse 88    Temp 97.7 F (36.5 C) (Oral)    Resp 16    LMP 05/13/2021    SpO2 100%   Visual Acuity Right Eye Distance:   Left Eye Distance:   Bilateral Distance:    Right Eye Near:   Left Eye Near:    Bilateral Near:     Physical Exam Vitals and nursing note reviewed.  Constitutional:      General: She is not in acute distress.    Appearance: She is well-developed and  normal weight. She is ill-appearing. She is not toxic-appearing or diaphoretic.  HENT:     Head: Normocephalic and atraumatic.     Right Ear: Tympanic membrane and ear canal normal. No drainage, swelling or tenderness. No middle ear effusion. Tympanic membrane is not erythematous.     Left Ear: Tympanic membrane and ear canal normal. No drainage, swelling or tenderness.  No middle ear effusion. Tympanic membrane is not erythematous.     Nose: No congestion or rhinorrhea.     Mouth/Throat:     Mouth: Mucous membranes are dry.     Pharynx: Oropharynx is clear. Uvula midline. Posterior oropharyngeal erythema and uvula swelling present. No pharyngeal swelling or oropharyngeal exudate.     Comments: Tonsil surgically absent Mucous retention cyst to L posterior pharynx Eyes:     Conjunctiva/sclera: Conjunctivae normal.     Pupils: Pupils are equal, round, and reactive to light.  Neck:     Thyroid: No thyromegaly.  Cardiovascular:     Rate and Rhythm: Normal rate and regular rhythm.     Heart sounds: Normal heart sounds. No murmur heard.   No friction rub. No gallop.  Pulmonary:     Effort: Pulmonary effort is normal. No respiratory distress.     Breath sounds: Normal breath sounds. No stridor. No wheezing, rhonchi or rales.  Chest:     Chest wall: No tenderness.  Abdominal:     General: There is no distension.     Palpations: Abdomen is soft.     Tenderness: There is no abdominal tenderness. There is no rebound.  Musculoskeletal:     Cervical back: Normal range of motion and neck supple.  Lymphadenopathy:     Cervical: No cervical adenopathy.  Skin:    General: Skin is warm.     Capillary Refill: Capillary refill takes less than 2 seconds.     Findings: No erythema or rash.  Neurological:     General: No focal deficit present.     Mental Status: She is alert and oriented to person, place, and time.  Psychiatric:        Mood and Affect: Mood normal.     UC Treatments /  Results   Labs (all labs ordered are listed, but only abnormal results are displayed) Labs Reviewed  CULTURE, GROUP A STREP [facility])  POCT RAPID STREP A, ED / UC    EKG   Radiology No results found.  Procedures Procedures (including critical care time)  Medications Ordered in UC Medications - No data to display  Initial Impression / Assessment and Plan / UC Course  I have reviewed the triage vital signs and the nursing notes.  Pertinent labs & imaging results that were available during my care of the patient were reviewed by me and considered in my medical decision making (see chart for details).     Uvulitis - low dose steroids (pt admits to adverse side effects on high doses) and antibiotics. Will send out throat culture. Salt water gargles, tea and honey may also be effective. Exposure to strep throat - throat culture sent in. Empiric tx based on exposure and clinical appearance.  Final Clinical Impressions(s) / UC Diagnoses   Final diagnoses:  Uvulitis  Exposure to strep throat     Discharge Instructions      Your rapid strep was NEGATIVE for strep throat. We have sent out a confirmation throat culture. Given your exposure to strep and your swollen uvula, I would like to start treatment regardless. Please start taking antibiotics as prescribed. Do not stop taking them until all has been completed. After completing the third day of antibiotics, throw away your current toothbrush and get a new one. This will prevent re-contamination. You may alternate ibuprofen and tylenol every 4 hours for fever and pain control. Do not share food, beverages, or kiss on the lips until >48 hours after starting antibiotics and >24 hours after fever resolved. This is contagious. Prednisone was called in for you to help with swelling of the uvula. Take in the morning.    ED Prescriptions     Medication Sig Dispense Auth. Provider   penicillin v potassium (VEETID) 500 MG tablet Take 1 tablet  (500 mg total) by mouth in the morning and at bedtime for 10 days. 20 tablet Tyhesha Dutson L, PA   predniSONE (DELTASONE) 10 MG tablet Take 2 tabs PO in a single dose day one and two, and 1 tab PO in a single dose day three and four 6 tablet Andrea Colglazier L, PA      PDMP not reviewed this encounter.   Maretta Bees, [place] 06/12/21 [phone]

## 2021-06-12 NOTE — ED Triage Notes (Signed)
Pt presents to the office for sore throat. Her daughter tested positive for strep.

## 2021-06-14 LAB — CULTURE, GROUP A STREP (THRC)

## 2021-07-17 ENCOUNTER — Ambulatory Visit (HOSPITAL_COMMUNITY)
Admission: EM | Admit: 2021-07-17 | Discharge: 2021-07-17 | Disposition: A | Discharge status: Home / Self Care | Payer: BC Managed Care – PPO | Attending: Nurse Practitioner | Admitting: Nurse Practitioner

## 2021-07-17 ENCOUNTER — Other Ambulatory Visit: Payer: Self-pay

## 2021-07-17 ENCOUNTER — Encounter (HOSPITAL_COMMUNITY): Payer: Self-pay | Admitting: Emergency Medicine

## 2021-07-17 DIAGNOSIS — Z20822 Contact with and (suspected) exposure to covid-19: Secondary | ICD-10-CM | POA: Diagnosis not present

## 2021-07-17 DIAGNOSIS — R059 Cough, unspecified: Secondary | ICD-10-CM | POA: Insufficient documentation

## 2021-07-17 DIAGNOSIS — J069 Acute upper respiratory infection, unspecified: Secondary | ICD-10-CM | POA: Insufficient documentation

## 2021-07-17 LAB — POC INFLUENZA A AND B ANTIGEN (URGENT CARE ONLY)
INFLUENZA A ANTIGEN, POC: NEGATIVE
INFLUENZA B ANTIGEN, POC: NEGATIVE

## 2021-07-17 MED ORDER — FLUTICASONE PROPIONATE 50 MCG/ACT NA SUSP: 2.0000 | Freq: Every day | NASAL | 0 refills | Status: AC

## 2021-07-17 MED ORDER — PSEUDOEPH-BROMPHEN-DM 30-2-10 MG/5ML PO SYRP: 5.0000 mL | ORAL_SOLUTION | Freq: Four times a day (QID) | ORAL | 0 refills | Status: AC | PRN

## 2021-07-17 NOTE — Discharge Instructions (Addendum)
Your influenza test is negative today.  You will be contacted if your COVID test is positive. ?Take medication as prescribed.  You may continue ibuprofen or Tylenol for pain, fever, or general discomfort. ?Antibiotics are not necessary for you at this time based on your current symptoms and the duration of your symptoms. ?Supportive care to include increasing fluids and getting plenty of rest. ?Follow-up if symptoms do not improve or if they worsen. ? ?

## 2021-07-17 NOTE — ED Triage Notes (Signed)
Patient c/o nonproductive cough x 2 days.  ? ?Patient denies fever or N/V/D.  ? ?Patient endorses chest pain while coughing.  ? ?Patient endorses chest congestion and generalized body aches.  ? ?Patient has taken OTC "cough and congestion gel pills" w/ no relief of symptoms.  ? ?History of Upper Respiratory infections and pneumonia.  ?

## 2021-07-17 NOTE — ED Provider Notes (Signed)
[facility]    CSN: [id number] Arrival date & time: 07/17/21  1002      History   Chief Complaint Chief Complaint  Patient presents with   Cough    HPI ANETRIA CONDRA is a 46 y.o. female.   The patient is a 46 year old female who presents with upper respiratory symptoms for the past 2 days.  She states symptoms started with nasal congestion, body aches, sore throat and a minor cough.  She states over the last 24 hours, her cough has worsened.  Cough is nonproductive, but she does have some chest pain and chest tightness with coughing.  She also endorses intermittent wheezing.  She denies fever, ear pain, or GI symptoms.  She has tried taking over-the-counter cough medicine and ibuprofen for her symptoms but states the medicine did not help.  She denies any sick contacts.  She has not received her flu vaccine, but has received 2 COVID vaccines.    Past Medical History:  Diagnosis Date   IBS (irritable bowel syndrome)     There are no problems to display for this patient.   Past Surgical History:  Procedure Laterality Date   ADENOIDECTOMY     CESAREAN SECTION     MANDIBLE SURGERY      OB History   No obstetric history on file.      Home Medications    Prior to Admission medications   Medication Sig Start Date End Date Taking? Authorizing Provider  brompheniramine-pseudoephedrine-DM 30-2-10 MG/5ML syrup Take 5 mLs by mouth 4 (four) times daily as needed. 07/17/21  Yes Leath-Warren, Sadie Haber, NP  fluticasone (FLONASE) 50 MCG/ACT nasal spray Place 2 sprays into both nostrils daily for 10 days. 07/17/21 07/27/21 Yes Leath-Warren, Sadie Haber, NP  Norgestimate-Ethinyl Estradiol Triphasic 0.18/0.215/0.25 MG-25 MCG tab Take 1 tablet by mouth daily.   Yes [provider]  pantoprazole (PROTONIX) 20 MG tablet Take 1 tablet (20 mg total) by mouth daily. Patient taking differently: Take 50 mg by mouth daily. 06/20/18  Yes [place] Scrape, Alyssa B, PA-C  sertraline  (ZOLOFT) 25 MG tablet Take 25 mg by mouth daily.   Yes [provider]  traZODone (DESYREL) 50 MG tablet Take 50 mg by mouth at bedtime.   Yes [provider]  omeprazole (PRILOSEC) 20 MG capsule Take 1 capsule (20 mg total) by mouth daily. 02/09/18   Cathie Hoops, Amy V, PA-C  ondansetron (ZOFRAN ODT) 4 MG disintegrating tablet Take 1 tablet (4 mg total) by mouth every 8 (eight) hours as needed for nausea or vomiting. 02/09/18   Cathie Hoops, Amy V, PA-C  potassium chloride SA (K-DUR,KLOR-CON) 20 MEQ tablet Take 1 tablet (20 mEq total) by mouth daily for 3 days. 06/20/18 06/23/18  Aviva Kluver B, PA-C  predniSONE (DELTASONE) 10 MG tablet Take 2 tabs PO in a single dose day one and two, and 1 tab PO in a single dose day three and four 06/12/21   Crain, Whitney L, PA  sucralfate (CARAFATE) 1 g tablet Take 1 tablet (1 g total) by mouth 4 (four) times daily -  with meals and at bedtime for 14 days. 06/20/18 07/04/18  Elisha Ponder, PA-C    Family History History reviewed. No pertinent family history.  Social History Social History   Tobacco Use   Smoking status: Never   Smokeless tobacco: Never  Vaping Use   Vaping Use: Never used  Substance Use Topics   Alcohol use: Yes    Comment: occ  Drug use: Never     Allergies   Other and Phenergan [promethazine]   Review of Systems Review of Systems  Constitutional:  Positive for activity change and fatigue. Negative for appetite change and fever.  HENT:  Positive for congestion, postnasal drip, rhinorrhea, sinus pressure and sore throat. Negative for sinus pain.   Eyes: Negative.   Respiratory:  Positive for cough, chest tightness and wheezing. Negative for shortness of breath.   Cardiovascular: Negative.   Gastrointestinal: Negative.   Skin: Negative.   Psychiatric/Behavioral: Negative.      Physical Exam Triage Vital Signs ED Triage Vitals  Enc Vitals Group     BP 07/17/21 1025 113/60     Pulse Rate 07/17/21 1025 84     Resp  07/17/21 1025 16     Temp 07/17/21 1025 97.7 F (36.5 C)     Temp Source 07/17/21 1025 Oral     SpO2 07/17/21 1025 100 %     Weight --      Height --      Head Circumference --      Peak Flow --      Pain Score 07/17/21 1022 5     Pain Loc --      Pain Edu? --      Excl. in GC? --    No data found.  Updated Vital Signs BP 113/60 (BP Location: Right Arm)   Pulse 84   Temp 97.7 F (36.5 C) (Oral)   Resp 16   LMP 07/14/2021 (Exact Date)   SpO2 100%   Visual Acuity Right Eye Distance:   Left Eye Distance:   Bilateral Distance:    Right Eye Near:   Left Eye Near:    Bilateral Near:     Physical Exam Vitals reviewed.  Constitutional:      General: She is not in acute distress.    Appearance: Normal appearance.  HENT:     Head: Normocephalic and atraumatic.     Right Ear: Tympanic membrane, ear canal and external ear normal.     Left Ear: Tympanic membrane, ear canal and external ear normal.     Nose: Congestion and rhinorrhea present.     Right Turbinates: Enlarged and swollen.     Left Turbinates: Enlarged and swollen.     Right Sinus: No maxillary sinus tenderness or frontal sinus tenderness.     Left Sinus: No maxillary sinus tenderness or frontal sinus tenderness.     Mouth/Throat:     Mouth: Mucous membranes are moist.     Pharynx: Posterior oropharyngeal erythema present. No oropharyngeal exudate.  Eyes:     Extraocular Movements: Extraocular movements intact.     Conjunctiva/sclera: Conjunctivae normal.     Pupils: Pupils are equal, round, and reactive to light.  Cardiovascular:     Rate and Rhythm: Normal rate and regular rhythm.     Pulses: Normal pulses.     Heart sounds: Normal heart sounds.  Pulmonary:     Effort: Pulmonary effort is normal.     Breath sounds: Normal breath sounds.  Abdominal:     General: Bowel sounds are normal.     Palpations: Abdomen is soft.     Tenderness: There is no abdominal tenderness.  Musculoskeletal:     Cervical  back: Normal range of motion.  Lymphadenopathy:     Cervical: No cervical adenopathy.  Skin:    General: Skin is warm and dry.     Capillary Refill: Capillary refill takes  less than 2 seconds.  Neurological:     Mental Status: She is alert and oriented to person, place, and time.  Psychiatric:        Mood and Affect: Mood normal.        Behavior: Behavior normal.     UC Treatments / Results  Labs (all labs ordered are listed, but only abnormal results are displayed) Labs Reviewed  SARS CORONAVIRUS 2 (TAT 6-24 HRS)  POC INFLUENZA A AND B ANTIGEN (URGENT CARE ONLY)    EKG   Radiology No results found.  Procedures Procedures (including critical care time)  Medications Ordered in UC Medications - No data to display  Initial Impression / Assessment and Plan / UC Course  I have reviewed the triage vital signs and the nursing notes.  Pertinent labs & imaging results that were available during my care of the patient were reviewed by me and considered in my medical decision making (see chart for details).  The patient presents with upper respiratory symptoms that started approximately 2 days ago.  She continues to endorse a worsening cough, which is nonproductive, general malaise, and congestion.  She does also have some postnasal drainage and intermittent wheezing.  She has remained afebrile, and does not have any symptoms suggestive of a bacterial upper respiratory infection to include purulent drainage or purulent sputum production.  Today her influenza test is negative.  COVID test was performed, awaiting the results.  Patient informed she will be contacted if her results are positive.  In the interim, will provide symptomatic treatment to include Promethazine DM for cough, fluticasone nasal spray for her nasal congestion.  Also feels she may benefit from starting cetirizine 10 mg daily.  Supportive care recommended to include increasing fluids and getting plenty of rest.  Patient  encouraged to follow-up if symptoms do not improve.  Final Clinical Impressions(s) / UC Diagnoses   Final diagnoses:  Viral URI with cough     Discharge Instructions      Your influenza test is negative today.  You will be contacted if your COVID test is positive. Take medication as prescribed.  You may continue ibuprofen or Tylenol for pain, fever, or general discomfort. Antibiotics are not necessary for you at this time based on your current symptoms and the duration of your symptoms. Supportive care to include increasing fluids and getting plenty of rest. Follow-up if symptoms do not improve or if they worsen.      ED Prescriptions     Medication Sig Dispense Auth. Provider   brompheniramine-pseudoephedrine-DM 30-2-10 MG/5ML syrup Take 5 mLs by mouth 4 (four) times daily as needed. 120 mL Leath-Warren, Sadie Haber, NP   fluticasone (FLONASE) 50 MCG/ACT nasal spray Place 2 sprays into both nostrils daily for 10 days. 16 g Leath-Warren, Sadie Haber, NP      PDMP not reviewed this encounter.   Abran Cantor, NP 07/17/21 1121

## 2021-07-18 LAB — SARS CORONAVIRUS 2 (TAT 6-24 HRS): SARS Coronavirus 2: NEGATIVE

## 2021-07-29 DIAGNOSIS — J029 Acute pharyngitis, unspecified: Secondary | ICD-10-CM | POA: Diagnosis not present

## 2021-07-29 DIAGNOSIS — J020 Streptococcal pharyngitis: Secondary | ICD-10-CM | POA: Diagnosis not present

## 2021-08-08 ENCOUNTER — Other Ambulatory Visit: Payer: Self-pay | Admitting: Nurse Practitioner

## 2021-08-18 DIAGNOSIS — R7989 Other specified abnormal findings of blood chemistry: Secondary | ICD-10-CM | POA: Diagnosis not present

## 2021-08-23 DIAGNOSIS — Z1331 Encounter for screening for depression: Secondary | ICD-10-CM | POA: Diagnosis not present

## 2021-08-23 DIAGNOSIS — Z1339 Encounter for screening examination for other mental health and behavioral disorders: Secondary | ICD-10-CM | POA: Diagnosis not present

## 2021-08-23 DIAGNOSIS — Z0000 Encounter for general adult medical examination without abnormal findings: Secondary | ICD-10-CM | POA: Diagnosis not present

## 2021-09-06 DIAGNOSIS — S61411A Laceration without foreign body of right hand, initial encounter: Secondary | ICD-10-CM | POA: Diagnosis not present

## 2021-09-06 DIAGNOSIS — M79641 Pain in right hand: Secondary | ICD-10-CM | POA: Diagnosis not present

## 2021-10-26 DIAGNOSIS — Z0000 Encounter for general adult medical examination without abnormal findings: Secondary | ICD-10-CM | POA: Diagnosis not present

## 2022-02-25 DIAGNOSIS — J020 Streptococcal pharyngitis: Secondary | ICD-10-CM | POA: Diagnosis not present

## 2022-02-25 DIAGNOSIS — J029 Acute pharyngitis, unspecified: Secondary | ICD-10-CM | POA: Diagnosis not present

## 2022-02-25 DIAGNOSIS — R509 Fever, unspecified: Secondary | ICD-10-CM | POA: Diagnosis not present

## 2022-03-03 DIAGNOSIS — R059 Cough, unspecified: Secondary | ICD-10-CM | POA: Diagnosis not present

## 2022-03-03 DIAGNOSIS — J019 Acute sinusitis, unspecified: Secondary | ICD-10-CM | POA: Diagnosis not present

## 2022-04-18 DIAGNOSIS — K219 Gastro-esophageal reflux disease without esophagitis: Secondary | ICD-10-CM | POA: Diagnosis not present

## 2022-04-18 DIAGNOSIS — K581 Irritable bowel syndrome with constipation: Secondary | ICD-10-CM | POA: Diagnosis not present

## 2022-05-10 DIAGNOSIS — Z1231 Encounter for screening mammogram for malignant neoplasm of breast: Secondary | ICD-10-CM | POA: Diagnosis not present

## 2022-05-10 DIAGNOSIS — Z01419 Encounter for gynecological examination (general) (routine) without abnormal findings: Secondary | ICD-10-CM | POA: Diagnosis not present

## 2022-05-10 DIAGNOSIS — Z124 Encounter for screening for malignant neoplasm of cervix: Secondary | ICD-10-CM | POA: Diagnosis not present

## 2022-05-13 DIAGNOSIS — N6002 Solitary cyst of left breast: Secondary | ICD-10-CM | POA: Diagnosis not present

## 2022-05-13 DIAGNOSIS — R928 Other abnormal and inconclusive findings on diagnostic imaging of breast: Secondary | ICD-10-CM | POA: Diagnosis not present

## 2022-05-13 DIAGNOSIS — R92332 Mammographic heterogeneous density, left breast: Secondary | ICD-10-CM | POA: Diagnosis not present

## 2022-07-18 DIAGNOSIS — K219 Gastro-esophageal reflux disease without esophagitis: Secondary | ICD-10-CM | POA: Diagnosis not present

## 2022-07-18 DIAGNOSIS — K581 Irritable bowel syndrome with constipation: Secondary | ICD-10-CM | POA: Diagnosis not present

## 2022-08-26 DIAGNOSIS — R7989 Other specified abnormal findings of blood chemistry: Secondary | ICD-10-CM | POA: Diagnosis not present

## 2022-08-26 DIAGNOSIS — R509 Fever, unspecified: Secondary | ICD-10-CM | POA: Diagnosis not present

## 2022-08-26 DIAGNOSIS — R5383 Other fatigue: Secondary | ICD-10-CM | POA: Diagnosis not present

## 2022-08-31 DIAGNOSIS — Z1339 Encounter for screening examination for other mental health and behavioral disorders: Secondary | ICD-10-CM | POA: Diagnosis not present

## 2022-08-31 DIAGNOSIS — Z0000 Encounter for general adult medical examination without abnormal findings: Secondary | ICD-10-CM | POA: Diagnosis not present

## 2022-08-31 DIAGNOSIS — Z1331 Encounter for screening for depression: Secondary | ICD-10-CM | POA: Diagnosis not present

## 2022-09-02 DIAGNOSIS — R0781 Pleurodynia: Secondary | ICD-10-CM | POA: Diagnosis not present

## 2022-09-09 ENCOUNTER — Ambulatory Visit
Admission: RE | Admit: 2022-09-09 | Discharge: 2022-09-09 | Disposition: A | Discharge status: Home / Self Care | Payer: BC Managed Care – PPO | Source: Ambulatory Visit | Attending: Family Medicine | Admitting: Family Medicine

## 2022-09-09 ENCOUNTER — Other Ambulatory Visit: Payer: Self-pay | Admitting: Family Medicine

## 2022-09-09 DIAGNOSIS — R079 Chest pain, unspecified: Secondary | ICD-10-CM

## 2022-09-09 DIAGNOSIS — R0789 Other chest pain: Secondary | ICD-10-CM | POA: Diagnosis not present

## 2022-09-09 DIAGNOSIS — R059 Cough, unspecified: Secondary | ICD-10-CM | POA: Diagnosis not present

## 2022-09-09 DIAGNOSIS — J219 Acute bronchiolitis, unspecified: Secondary | ICD-10-CM | POA: Diagnosis not present

## 2022-09-09 MED ORDER — IOPAMIDOL (ISOVUE-370) INJECTION 76%
75.0000 mL | Freq: Once | INTRAVENOUS | Status: AC | PRN
  Administered 2022-09-09: 75 mL via INTRAVENOUS

## 2022-10-18 DIAGNOSIS — K581 Irritable bowel syndrome with constipation: Secondary | ICD-10-CM | POA: Diagnosis not present

## 2023-01-17 DIAGNOSIS — K5900 Constipation, unspecified: Secondary | ICD-10-CM | POA: Diagnosis not present

## 2023-01-17 DIAGNOSIS — K581 Irritable bowel syndrome with constipation: Secondary | ICD-10-CM | POA: Diagnosis not present

## 2023-01-17 DIAGNOSIS — K219 Gastro-esophageal reflux disease without esophagitis: Secondary | ICD-10-CM | POA: Diagnosis not present

## 2023-01-26 DIAGNOSIS — M6289 Other specified disorders of muscle: Secondary | ICD-10-CM | POA: Diagnosis not present

## 2023-01-26 DIAGNOSIS — K648 Other hemorrhoids: Secondary | ICD-10-CM | POA: Diagnosis not present

## 2023-01-26 DIAGNOSIS — N816 Rectocele: Secondary | ICD-10-CM | POA: Diagnosis not present

## 2023-01-26 DIAGNOSIS — R198 Other specified symptoms and signs involving the digestive system and abdomen: Secondary | ICD-10-CM | POA: Diagnosis not present

## 2023-02-09 DIAGNOSIS — R198 Other specified symptoms and signs involving the digestive system and abdomen: Secondary | ICD-10-CM | POA: Diagnosis not present

## 2023-02-22 DIAGNOSIS — R140 Abdominal distension (gaseous): Secondary | ICD-10-CM | POA: Diagnosis not present

## 2023-02-22 DIAGNOSIS — R1013 Epigastric pain: Secondary | ICD-10-CM | POA: Diagnosis not present

## 2023-02-22 DIAGNOSIS — K5900 Constipation, unspecified: Secondary | ICD-10-CM | POA: Diagnosis not present

## 2023-02-22 DIAGNOSIS — R109 Unspecified abdominal pain: Secondary | ICD-10-CM | POA: Diagnosis not present

## 2023-03-16 DIAGNOSIS — M6289 Other specified disorders of muscle: Secondary | ICD-10-CM | POA: Diagnosis not present

## 2023-03-20 DIAGNOSIS — M545 Low back pain, unspecified: Secondary | ICD-10-CM | POA: Diagnosis not present

## 2023-03-20 DIAGNOSIS — R1030 Lower abdominal pain, unspecified: Secondary | ICD-10-CM | POA: Diagnosis not present

## 2023-03-20 DIAGNOSIS — K582 Mixed irritable bowel syndrome: Secondary | ICD-10-CM | POA: Diagnosis not present

## 2023-03-20 DIAGNOSIS — M6289 Other specified disorders of muscle: Secondary | ICD-10-CM | POA: Diagnosis not present

## 2023-04-05 DIAGNOSIS — K581 Irritable bowel syndrome with constipation: Secondary | ICD-10-CM | POA: Diagnosis not present

## 2023-04-05 DIAGNOSIS — M6289 Other specified disorders of muscle: Secondary | ICD-10-CM | POA: Diagnosis not present

## 2023-05-08 DIAGNOSIS — M6289 Other specified disorders of muscle: Secondary | ICD-10-CM | POA: Diagnosis not present

## 2023-05-08 DIAGNOSIS — R1030 Lower abdominal pain, unspecified: Secondary | ICD-10-CM | POA: Diagnosis not present

## 2023-05-08 DIAGNOSIS — M545 Low back pain, unspecified: Secondary | ICD-10-CM | POA: Diagnosis not present

## 2023-05-08 DIAGNOSIS — K582 Mixed irritable bowel syndrome: Secondary | ICD-10-CM | POA: Diagnosis not present

## 2023-05-18 DIAGNOSIS — Z1331 Encounter for screening for depression: Secondary | ICD-10-CM | POA: Diagnosis not present

## 2023-05-18 DIAGNOSIS — Z124 Encounter for screening for malignant neoplasm of cervix: Secondary | ICD-10-CM | POA: Diagnosis not present

## 2023-05-18 DIAGNOSIS — Z1231 Encounter for screening mammogram for malignant neoplasm of breast: Secondary | ICD-10-CM | POA: Diagnosis not present

## 2023-05-18 DIAGNOSIS — Z01419 Encounter for gynecological examination (general) (routine) without abnormal findings: Secondary | ICD-10-CM | POA: Diagnosis not present

## 2023-06-29 DIAGNOSIS — M6289 Other specified disorders of muscle: Secondary | ICD-10-CM | POA: Diagnosis not present

## 2023-06-29 DIAGNOSIS — K581 Irritable bowel syndrome with constipation: Secondary | ICD-10-CM | POA: Diagnosis not present

## 2023-06-29 DIAGNOSIS — R1013 Epigastric pain: Secondary | ICD-10-CM | POA: Diagnosis not present

## 2023-08-24 DIAGNOSIS — L708 Other acne: Secondary | ICD-10-CM | POA: Diagnosis not present

## 2023-08-24 DIAGNOSIS — L821 Other seborrheic keratosis: Secondary | ICD-10-CM | POA: Diagnosis not present

## 2023-08-24 DIAGNOSIS — L814 Other melanin hyperpigmentation: Secondary | ICD-10-CM | POA: Diagnosis not present

## 2023-08-24 DIAGNOSIS — L578 Other skin changes due to chronic exposure to nonionizing radiation: Secondary | ICD-10-CM | POA: Diagnosis not present

## 2023-08-30 DIAGNOSIS — Z79899 Other long term (current) drug therapy: Secondary | ICD-10-CM | POA: Diagnosis not present

## 2023-08-30 DIAGNOSIS — K589 Irritable bowel syndrome without diarrhea: Secondary | ICD-10-CM | POA: Diagnosis not present

## 2023-09-06 DIAGNOSIS — Z0000 Encounter for general adult medical examination without abnormal findings: Secondary | ICD-10-CM | POA: Diagnosis not present

## 2023-09-06 DIAGNOSIS — Z1331 Encounter for screening for depression: Secondary | ICD-10-CM | POA: Diagnosis not present

## 2023-09-06 DIAGNOSIS — Z1339 Encounter for screening examination for other mental health and behavioral disorders: Secondary | ICD-10-CM | POA: Diagnosis not present

## 2023-10-09 DIAGNOSIS — J029 Acute pharyngitis, unspecified: Secondary | ICD-10-CM | POA: Diagnosis not present

## 2023-10-09 DIAGNOSIS — U071 COVID-19: Secondary | ICD-10-CM | POA: Diagnosis not present

## 2023-10-09 DIAGNOSIS — R52 Pain, unspecified: Secondary | ICD-10-CM | POA: Diagnosis not present

## 2023-10-18 DIAGNOSIS — H6692 Otitis media, unspecified, left ear: Secondary | ICD-10-CM | POA: Diagnosis not present

## 2023-10-18 DIAGNOSIS — H9202 Otalgia, left ear: Secondary | ICD-10-CM | POA: Diagnosis not present

## 2023-10-18 DIAGNOSIS — H6092 Unspecified otitis externa, left ear: Secondary | ICD-10-CM | POA: Diagnosis not present

## 2024-01-16 DIAGNOSIS — J019 Acute sinusitis, unspecified: Secondary | ICD-10-CM | POA: Diagnosis not present

## 2024-01-16 DIAGNOSIS — B9789 Other viral agents as the cause of diseases classified elsewhere: Secondary | ICD-10-CM | POA: Diagnosis not present

## 2024-01-16 DIAGNOSIS — R059 Cough, unspecified: Secondary | ICD-10-CM | POA: Diagnosis not present

## 2024-01-16 DIAGNOSIS — Z20822 Contact with and (suspected) exposure to covid-19: Secondary | ICD-10-CM | POA: Diagnosis not present

## 2024-01-16 DIAGNOSIS — J020 Streptococcal pharyngitis: Secondary | ICD-10-CM | POA: Diagnosis not present

## 2024-02-07 DIAGNOSIS — L089 Local infection of the skin and subcutaneous tissue, unspecified: Secondary | ICD-10-CM | POA: Diagnosis not present

## 2024-02-07 DIAGNOSIS — S71111A Laceration without foreign body, right thigh, initial encounter: Secondary | ICD-10-CM | POA: Diagnosis not present

## 2024-02-07 DIAGNOSIS — B9689 Other specified bacterial agents as the cause of diseases classified elsewhere: Secondary | ICD-10-CM | POA: Diagnosis not present
